# Patient Record
Sex: Female | Born: 1995 | Race: White | Hispanic: No | Marital: Single | Smoking: Current every day smoker
Health system: Southern US, Community
[De-identification: ages and names within clinical notes are randomized; demographics above are authoritative.]

## PROBLEM LIST (undated history)

## (undated) DIAGNOSIS — G43909 Migraine, unspecified, not intractable, without status migrainosus: Secondary | ICD-10-CM

## (undated) DIAGNOSIS — Z86018 Personal history of other benign neoplasm: Secondary | ICD-10-CM

## (undated) HISTORY — DX: Migraine, unspecified, not intractable, without status migrainosus: G43.909

## (undated) HISTORY — DX: Personal history of other benign neoplasm: Z86.018

## (undated) HISTORY — PX: TONSILLECTOMY AND ADENOIDECTOMY: SUR1326

---
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
## 2017-08-23 ENCOUNTER — Ambulatory Visit: Payer: Self-pay | Admitting: Family Medicine

## 2017-08-26 ENCOUNTER — Encounter: Payer: Self-pay | Admitting: General Practice

## 2020-09-19 ENCOUNTER — Other Ambulatory Visit: Payer: Self-pay | Admitting: *Deleted

## 2020-09-19 ENCOUNTER — Encounter: Payer: Self-pay | Admitting: *Deleted

## 2020-09-21 ENCOUNTER — Ambulatory Visit: Payer: No Typology Code available for payment source | Admitting: Diagnostic Neuroimaging

## 2020-11-25 ENCOUNTER — Ambulatory Visit: Payer: No Typology Code available for payment source | Admitting: Diagnostic Neuroimaging

## 2020-11-25 ENCOUNTER — Encounter: Payer: Self-pay | Admitting: Diagnostic Neuroimaging

## 2020-11-25 ENCOUNTER — Other Ambulatory Visit: Payer: Self-pay

## 2020-11-25 VITALS — BP 143/80 | HR 98 | Ht 65.0 in

## 2020-11-25 DIAGNOSIS — G43109 Migraine with aura, not intractable, without status migrainosus: Secondary | ICD-10-CM | POA: Diagnosis not present

## 2020-11-25 DIAGNOSIS — E237 Disorder of pituitary gland, unspecified: Secondary | ICD-10-CM

## 2020-11-25 MED ORDER — RIZATRIPTAN BENZOATE 10 MG PO TABS: ORAL_TABLET | ORAL | 6 refills | Status: DC

## 2020-11-25 MED ORDER — TOPIRAMATE 50 MG PO TABS: 50.0000 mg | ORAL_TABLET | Freq: Two times a day (BID) | ORAL | 12 refills | Status: AC

## 2020-11-25 NOTE — Patient Instructions (Signed)
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

## 2020-11-25 NOTE — Progress Notes (Signed)
[facility]  PATIENT: Frances Thompson DOB: [birth year]  REFERRING CLINICIAN: Jake Samples, PA* HISTORY FROM: patient  REASON FOR VISIT: new consult    HISTORICAL  CHIEF COMPLAINT:  Chief Complaint  Patient presents with  . New Patient (Initial Visit)    Rm 6, with fiance Cory, reports 2 migraines a week, takes OTC Tylenol with no relief, c/o mild dull headache today    HISTORY OF PRESENT ILLNESS:   24 year old female here for evaluation of headaches.  Her past 1 year patient has had intermittent frontal throbbing headaches associated with photophobia, phonophobia, seeing spots.  No nausea vomiting.  Headaches can last all day.  Averaging 2 headaches per week.  Patient has family history of migraine in sister.  Patient also had history of decreased hearing as a teenager, had MRI of the brain which showed small pituitary lesion.  Initially this was thought to be microadenoma but follow-up studies over 2-3 years showed reduction in size of lesion and therefore this was felt to be benign cyst.    REVIEW OF SYSTEMS: Full 14 system review of systems performed and negative with exception of: As per HPI.  ALLERGIES: Allergies  Allergen Reactions  . Clindamycin/Lincomycin Rash  . Penicillins Swelling  . Doxycycline Hives  . Phentermine Other (See Comments)    Heart flutter    HOME MEDICATIONS: Outpatient Medications Prior to Visit  Medication Sig Dispense Refill  . cetirizine (ZYRTEC) 10 MG chewable tablet Chew 10 mg by mouth daily.     No facility-administered medications prior to visit.    PAST MEDICAL HISTORY: Past Medical History:  Diagnosis Date  . History of benign pituitary tumor    age 50  . Migraines     PAST SURGICAL HISTORY: Past Surgical History:  Procedure Laterality Date  . TONSILLECTOMY AND ADENOIDECTOMY      FAMILY HISTORY: Family History  Problem Relation Age of Onset  . Migraines Mother   . Hypertension Mother    . Hyperlipidemia Mother   . Schizophrenia Sister   . Bipolar disorder Sister   . Stroke Maternal Grandmother   . COPD Maternal Grandmother   . Heart attack Maternal Grandmother   . Suicidality Maternal Grandfather     SOCIAL HISTORY: Social History   Socioeconomic History  . Marital status: Single    Spouse name: Not on file  . Number of children: 0  . Years of education: Not on file  . Highest education level: Not on file  Occupational History  . Not on file  Tobacco Use  . Smoking status: Current Every Day Smoker  . Smokeless tobacco: Never Used  Substance and Sexual Activity  . Alcohol use: Not Currently  . Drug use: Not Currently  . Sexual activity: Not on file  Other Topics Concern  . Not on file  Social History Narrative   Lives alone   Social Determinants of Health   Financial Resource Strain: Not on file  Food Insecurity: Not on file  Transportation Needs: Not on file  Physical Activity: Not on file  Stress: Not on file  Social Connections: Not on file  Intimate Partner Violence: Not on file     PHYSICAL EXAM  GENERAL EXAM/CONSTITUTIONAL: Vitals:  Vitals:   11/25/20 1005  BP: (!) 143/80  Pulse: 98  Height: 5\' 5"  (1.651 m)     There is no height or weight on file to calculate BMI. Wt Readings from Last 3 Encounters:  No data found for [employer]  Patient is in no distress; well developed, nourished and groomed; neck is supple  CARDIOVASCULAR:  Examination of carotid arteries is normal; no carotid bruits  Regular rate and rhythm, no murmurs  Examination of peripheral vascular system by observation and palpation is normal  EYES:  Ophthalmoscopic exam of optic discs and posterior segments is normal; no papilledema or hemorrhages  No exam data present  MUSCULOSKELETAL:  Gait, strength, tone, movements noted in Neurologic exam below  NEUROLOGIC: MENTAL STATUS:  No flowsheet data found.  awake, alert, oriented to person, place and  time  recent and remote memory intact  normal attention and concentration  language fluent, comprehension intact, naming intact  fund of knowledge appropriate  CRANIAL NERVE:   2nd - no papilledema on fundoscopic exam  2nd, 3rd, 4th, 6th - pupils equal and reactive to light, visual fields full to confrontation, extraocular muscles intact, no nystagmus  5th - facial sensation symmetric  7th - facial strength symmetric  8th - hearing intact  9th - palate elevates symmetrically, uvula midline  11th - shoulder shrug symmetric  12th - tongue protrusion midline  MOTOR:   normal bulk and tone, full strength in the BUE, BLE  SENSORY:   normal and symmetric to light touch, temperature, vibration  COORDINATION:   finger-nose-finger, fine finger movements normal  REFLEXES:   deep tendon reflexes present and symmetric  GAIT/STATION:   narrow based gait     DIAGNOSTIC DATA (LABS, IMAGING, TESTING) - I reviewed patient records, labs, notes, testing and imaging myself where available.  No results found for: WBC, HGB, HCT, MCV, PLT No results found for: NA, K, CL, CO2, GLUCOSE, BUN, CREATININE, CALCIUM, PROT, ALBUMIN, AST, ALT, ALKPHOS, BILITOT, GFRNONAA, GFRAA No results found for: CHOL, HDL, LDLCALC, LDLDIRECT, TRIG, CHOLHDL No results found for: HGBA1C No results found for: VITAMINB12 No results found for: TSH   06/11/12 MRI brain [I reviewed images myself and agree with interpretation. -VRP]  1. 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  2. The remainder of the examination within normal limits.  12/28/12 MRI brain - Similar appearance of pituitary lesion as detailed above.   12/30/13 MRI brain [I reviewed images myself and agree with interpretation. 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 2 mm diameter cavity  remains near the pars Intermedia, seen best on sagittal postcontrast  imaging.     ASSESSMENT AND PLAN  24 y.o. year old female here with:  Dx:  1. Migraine with aura and without status migrainosus, not intractable   2. 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 MR BRAIN W WO CONTRAST    Meds ordered this encounter  Medications  . topiramate (TOPAMAX) 50 MG tablet    Sig: Take 1 tablet (50 mg total) by mouth 2 (two) times daily.    Dispense:  60 tablet    Refill:  12  . rizatriptan (MAXALT) 10 MG tablet    Sig: As needed for headache; may repeat in 2 hours if needed; max 2 per day or 8 per month    Dispense:  8 tablet    Refill:  6   Return in about 6 months (around 05/26/2021) for with NP (Amy Lomax).    Penni Bombard, MD 89/48/3475, 83:07 AM Certified in Neurology, Neurophysiology and Neuroimaging  [facility] [address] [place], [place] [address] [phone]

## 2020-11-28 ENCOUNTER — Telehealth: Payer: Self-pay | Admitting: Diagnostic Neuroimaging

## 2020-11-28 NOTE — Telephone Encounter (Signed)
[facility] pending faxed notes

## 2020-12-07 NOTE — Telephone Encounter (Signed)
12/07/2020 Sent to [facility] Telephone [phone] MRI Berkley Harvey [id number] 11/28/2020 01/202022 Reff # [id number] .  [id number] Arma Heading Sent via E - mail / via [employer]

## 2020-12-20 ENCOUNTER — Encounter: Payer: Self-pay | Admitting: Internal Medicine

## 2020-12-24 ENCOUNTER — Other Ambulatory Visit: Payer: Self-pay

## 2021-01-18 ENCOUNTER — Ambulatory Visit: Payer: No Typology Code available for payment source | Admitting: Internal Medicine

## 2021-02-08 ENCOUNTER — Encounter: Payer: Self-pay | Admitting: Internal Medicine

## 2021-02-08 ENCOUNTER — Other Ambulatory Visit: Payer: Self-pay

## 2021-02-08 ENCOUNTER — Ambulatory Visit (INDEPENDENT_AMBULATORY_CARE_PROVIDER_SITE_OTHER): Payer: No Typology Code available for payment source | Admitting: Internal Medicine

## 2021-02-08 ENCOUNTER — Encounter: Payer: Self-pay | Admitting: *Deleted

## 2021-02-08 VITALS — BP 161/103 | HR 101 | Temp 97.8°F | Ht 64.0 in | Wt 324.2 lb

## 2021-02-08 DIAGNOSIS — R112 Nausea with vomiting, unspecified: Secondary | ICD-10-CM | POA: Diagnosis not present

## 2021-02-08 DIAGNOSIS — K5900 Constipation, unspecified: Secondary | ICD-10-CM

## 2021-02-08 DIAGNOSIS — R1013 Epigastric pain: Secondary | ICD-10-CM

## 2021-02-08 MED ORDER — ONDANSETRON HCL 4 MG PO TABS: 4.0000 mg | ORAL_TABLET | Freq: Three times a day (TID) | ORAL | 1 refills | Status: AC | PRN

## 2021-02-08 NOTE — Patient Instructions (Signed)
We will schedule you for upper endoscopy to further evaluate your abdominal pain and nausea.  I will also schedule ultrasound to evaluate your gallbladder.  I have sent in a prescription for Zofran to take as needed for nausea.  Further recommendations to follow.  At [facility] we value your feedback. You may receive a survey about your visit today. Please share your experience as we strive to create trusting relationships with our patients to provide genuine, compassionate, quality care.  We appreciate your understanding and patience as we review any laboratory studies, imaging, and other diagnostic tests that are ordered as we care for you. Our office policy is 5 business days for review of these results, and any emergent or urgent results are addressed in a timely manner for your best interest. If you do not hear from our office in 1 week, please contact us.   We also encourage the use of MyChart, which contains your medical information for your review as well. If you are not enrolled in this feature, an access code is on this after visit summary for your convenience. Thank you for allowing [place] to be involved in your care.  It was great to see you today!  I hope you have a great rest of your spring!!    Elon Alas. Abbey Chatters, D.O. Gastroenterology and Hepatology [facility]

## 2021-02-08 NOTE — Progress Notes (Signed)
Primary Care Physician:  Redmond School, MD Primary Gastroenterologist:  Dr. Abbey Chatters  Chief Complaint  Patient presents with  . Nausea    Worse after meals and before bed  . Abdominal Pain    Upper abd/luq, comes/goes  . Constipation    X 2-3 weeks     HPI:   Frances Thompson is a 25 y.o. female who presents to the clinic today by referral from her PCP Dr. Gerarda Fraction for evaluation.  Patient states she has postprandial nausea with occasional vomiting.  Denies any coffee-ground emesis or hematemesis.  No melena hematochezia.  Also notes epigastric pain that radiates to her back sometimes. this has been going on for some time now.  She was placed on omeprazole 20 mg twice daily which helped her symptoms some.  No overt dysphagia but does note personal history of allergic rhinitis and asthma.  No chronic NSAID use.  Patient also notes constipation which started approximately 1 month ago.  She used to go 2-3 times daily but now only goes 1 time a day.  Past Medical History:  Diagnosis Date  . History of benign pituitary tumor    age 64  . Migraines     Past Surgical History:  Procedure Laterality Date  . TONSILLECTOMY AND ADENOIDECTOMY      Current Outpatient Medications  Medication Sig Dispense Refill  . cetirizine (ZYRTEC) 10 MG chewable tablet Chew 10 mg by mouth daily.    Marland Kitchen topiramate (TOPAMAX) 50 MG tablet Take 1 tablet (50 mg total) by mouth 2 (two) times daily. (Patient taking differently: Take 50 mg by mouth 2 (two) times daily. As needed) 60 tablet 12   No current facility-administered medications for this visit.    Allergies as of 02/08/2021 - Review Complete 02/08/2021  Allergen Reaction Noted  . Clindamycin/lincomycin Rash 04/08/2015  . Penicillins Swelling 04/08/2015  . Doxycycline Hives 09/19/2020  . Phentermine Other (See Comments) 09/19/2020    Family History  Problem Relation Age of Onset  . Migraines Mother   . Hypertension Mother   . Hyperlipidemia  Mother   . Schizophrenia Sister   . Bipolar disorder Sister   . Stroke Maternal Grandmother   . COPD Maternal Grandmother   . Heart attack Maternal Grandmother   . Suicidality Maternal Grandfather     Social History   Socioeconomic History  . Marital status: Single    Spouse name: Not on file  . Number of children: 0  . Years of education: Not on file  . Highest education level: Not on file  Occupational History  . Not on file  Tobacco Use  . Smoking status: Current Every Day Smoker  . Smokeless tobacco: Never Used  Substance and Sexual Activity  . Alcohol use: Not Currently  . Drug use: Not Currently  . Sexual activity: Not on file  Other Topics Concern  . Not on file  Social History Narrative   Lives alone   Social Determinants of Health   Financial Resource Strain: Not on file  Food Insecurity: Not on file  Transportation Needs: Not on file  Physical Activity: Not on file  Stress: Not on file  Social Connections: Not on file  Intimate Partner Violence: Not on file    Subjective: Review of Systems  Constitutional: Negative for chills and fever.  HENT: Negative for congestion and hearing loss.   Eyes: Negative for blurred vision and double vision.  Respiratory: Negative for cough and shortness of breath.   Cardiovascular: Negative  for chest pain and palpitations.  Gastrointestinal: Negative for abdominal pain, blood in stool, constipation, diarrhea, heartburn, melena and vomiting.  Genitourinary: Negative for dysuria and urgency.  Musculoskeletal: Negative for joint pain and myalgias.  Skin: Negative for itching and rash.  Neurological: Negative for dizziness and headaches.  Psychiatric/Behavioral: Negative for depression. The patient is not nervous/anxious.        Objective: BP (!) 161/103   Pulse (!) 101   Temp 97.8 F (36.6 C)   Ht 5\' 4"  (1.626 m)   Wt (!) 324 lb 3.2 oz (147.1 kg)   LMP 12/12/2020   BMI 55.65 kg/m  Physical  Exam Constitutional:      Appearance: Normal appearance.  HENT:     Head: Normocephalic and atraumatic.  Eyes:     Extraocular Movements: Extraocular movements intact.     Conjunctiva/sclera: Conjunctivae normal.  Cardiovascular:     Rate and Rhythm: Normal rate and regular rhythm.  Pulmonary:     Effort: Pulmonary effort is normal.     Breath sounds: Normal breath sounds.  Abdominal:     General: Bowel sounds are normal.     Palpations: Abdomen is soft.  Musculoskeletal:        General: No swelling. Normal range of motion.     Cervical back: Normal range of motion and neck supple.  Skin:    General: Skin is warm and dry.     Coloration: Skin is not jaundiced.  Neurological:     General: No focal deficit present.     Mental Status: She is alert and oriented to person, place, and time.  Psychiatric:        Mood and Affect: Mood normal.        Behavior: Behavior normal.      Assessment: *Epigastric pain *Nausea and vomiting *Constipation  Plan: Etiology of patient's symptoms unclear, we will schedule for EGD to evaluate for peptic ulcer disease, esophagitis, gastritis, H. Pylori, duodenitis, or other. Will also evaluate for esophageal stricture, Schatzki's ring, esophageal web or other.   The risks including infection, bleed, or perforation as well as benefits, limitations, alternatives and imponderables have been reviewed with the patient. Potential for esophageal dilation, biopsy, etc. have also been reviewed.  Questions have been answered. All parties agreeable.  Order right upper quadrant ultrasound to evaluate for biliary colic.  I have sent in a prescription for Zofran to take as needed for nausea.  Thank you Dr. Gerarda Fraction for the kind referral.  Further recommendations to follow   02/08/2021 3:14 PM   Disclaimer: This note was dictated with voice recognition software. Similar sounding words can inadvertently be transcribed and may not be corrected upon review.

## 2021-02-09 ENCOUNTER — Encounter: Payer: Self-pay | Admitting: *Deleted

## 2021-02-16 ENCOUNTER — Telehealth: Payer: Self-pay

## 2021-02-16 ENCOUNTER — Other Ambulatory Visit: Payer: Self-pay

## 2021-02-16 NOTE — Telephone Encounter (Signed)
[facility], spoke to [place], no PA needed for EGD. Pt has no surgery benefits. Ref# [id number].

## 2021-02-17 ENCOUNTER — Ambulatory Visit (HOSPITAL_COMMUNITY)
Admission: RE | Admit: 2021-02-17 | Discharge: 2021-02-17 | Disposition: A | Discharge status: Home / Self Care | Payer: Self-pay | Source: Ambulatory Visit | Attending: Internal Medicine | Admitting: Internal Medicine

## 2021-02-17 DIAGNOSIS — R112 Nausea with vomiting, unspecified: Secondary | ICD-10-CM | POA: Insufficient documentation

## 2021-02-17 DIAGNOSIS — R1013 Epigastric pain: Secondary | ICD-10-CM | POA: Insufficient documentation

## 2021-03-08 NOTE — Patient Instructions (Signed)
Frances Thompson  03/08/2021     @PREFPERIOPPHARMACY @   Your procedure is scheduled on  03/14/2021.   [facility]   Call this number if you have problems the morning of surgery:  [phone]   Remember:  Follow the diet instructions given to you by the office.                     Take these medicines the morning of surgery with A SIP OF WATER  Prilosec.    Please brush your teeth.  Do not wear jewelry, make-up or nail polish.  Do not wear lotions, powders, or perfumes, or deodorant.  Do not shave 48 hours prior to surgery.  Men may shave face and neck.  Do not bring valuables to the hospital.  [facility] is not responsible for any belongings or valuables.  Contacts, dentures or bridgework may not be worn into surgery.  Leave your suitcase in the car.  After surgery it may be brought to your room.  For patients admitted to the hospital, discharge time will be determined by your treatment team.  Patients discharged the day of surgery will not be allowed to drive home and must have someone with them for 24 hours.    Special instructions:  DO NOT smoke tobacco or vape the morning of your procedure.  Please read over the following fact sheets that you were given. Anesthesia Post-op Instructions and Care and Recovery After Surgery       Upper Endoscopy, Adult, Care After This sheet gives you information about how to care for yourself after your procedure. Your health care provider may also give you more specific instructions. If you have problems or questions, contact your health care provider. What can I expect after the procedure? After the procedure, it is common to have:  A sore throat.  Mild stomach pain or discomfort.  Bloating.  Nausea. Follow these instructions at home:  Follow instructions from your health care provider about what to eat or drink after your procedure.  Return to your normal activities as told by your health  care provider. Ask your health care provider what activities are safe for you.  Take over-the-counter and prescription medicines only as told by your health care provider.  If you were given a sedative during the procedure, it can affect you for several hours. Do not drive or operate machinery until your health care provider says that it is safe.  Keep all follow-up visits as told by your health care provider. This is important.   Contact a health care provider if you have:  A sore throat that lasts longer than one day.  Trouble swallowing. Get help right away if:  You vomit blood or your vomit looks like coffee grounds.  You have: ? A fever. ? Bloody, black, or tarry stools. ? A severe sore throat or you cannot swallow. ? Difficulty breathing. ? Severe pain in your chest or abdomen. Summary  After the procedure, it is common to have a sore throat, mild stomach discomfort, bloating, and nausea.  If you were given a sedative during the procedure, it can affect you for several hours. Do not drive or operate machinery until your health care provider says that it is safe.  Follow instructions from your health care provider about what to eat or drink after your procedure.  Return to your normal activities as told by your health  care provider. This information is not intended to replace advice given to you by your health care provider. Make sure you discuss any questions you have with your health care provider. Document Revised: 11/24/2019 Document Reviewed: 04/28/2018 Elsevier Patient Education  2021 [place] After This sheet gives you information about how to care for yourself after your procedure. Your health care provider may also give you more specific instructions. If you have problems or questions, contact your health care provider. What can I expect after the procedure? After the procedure, it is common to  have:  Tiredness.  Forgetfulness about what happened after the procedure.  Impaired judgment for important decisions.  Nausea or vomiting.  Some difficulty with balance. Follow these instructions at home: For the time period you were told by your health care provider:  Rest as needed.  Do not participate in activities where you could fall or become injured.  Do not drive or use machinery.  Do not drink alcohol.  Do not take sleeping pills or medicines that cause drowsiness.  Do not make important decisions or sign legal documents.  Do not take care of children on your own.      Eating and drinking  Follow the diet that is recommended by your health care provider.  Drink enough fluid to keep your urine pale yellow.  If you vomit: ? Drink water, juice, or soup when you can drink without vomiting. ? Make sure you have little or no nausea before eating solid foods. General instructions  Have a responsible adult stay with you for the time you are told. It is important to have someone help care for you until you are awake and alert.  Take over-the-counter and prescription medicines only as told by your health care provider.  If you have sleep apnea, surgery and certain medicines can increase your risk for breathing problems. Follow instructions from your health care provider about wearing your sleep device: ? Anytime you are sleeping, including during daytime naps. ? While taking prescription pain medicines, sleeping medicines, or medicines that make you drowsy.  Avoid smoking.  Keep all follow-up visits as told by your health care provider. This is important. Contact a health care provider if:  You keep feeling nauseous or you keep vomiting.  You feel light-headed.  You are still sleepy or having trouble with balance after 24 hours.  You develop a rash.  You have a fever.  You have redness or swelling around the IV site. Get help right away if:  You have  trouble breathing.  You have new-onset confusion at home. Summary  For several hours after your procedure, you may feel tired. You may also be forgetful and have poor judgment.  Have a responsible adult stay with you for the time you are told. It is important to have someone help care for you until you are awake and alert.  Rest as told. Do not drive or operate machinery. Do not drink alcohol or take sleeping pills.  Get help right away if you have trouble breathing, or if you suddenly become confused. This information is not intended to replace advice given to you by your health care provider. Make sure you discuss any questions you have with your health care provider. Document Revised: 08/11/2020 Document Reviewed: 10/29/2019 Elsevier Patient Education  2021 [employer].

## 2021-03-10 ENCOUNTER — Encounter (HOSPITAL_COMMUNITY): Payer: Self-pay

## 2021-03-10 ENCOUNTER — Other Ambulatory Visit (HOSPITAL_COMMUNITY): Payer: No Typology Code available for payment source

## 2021-03-10 ENCOUNTER — Telehealth: Payer: Self-pay | Admitting: *Deleted

## 2021-03-10 ENCOUNTER — Encounter (HOSPITAL_COMMUNITY)
Admission: RE | Admit: 2021-03-10 | Discharge: 2021-03-10 | Disposition: A | Discharge status: Home / Self Care | Payer: No Typology Code available for payment source | Source: Ambulatory Visit | Attending: Internal Medicine | Admitting: Internal Medicine

## 2021-03-10 NOTE — Telephone Encounter (Signed)
Called pt as received message she no showed for pre-op/covid test appt today. She needs to r/s at a later time and she is going to call back toward end of the month to r/s. Called endo and made aware

## 2021-03-14 ENCOUNTER — Ambulatory Visit (HOSPITAL_COMMUNITY): Admission: RE | Admit: 2021-03-14 | Payer: No Typology Code available for payment source | Source: Home / Self Care

## 2021-03-14 ENCOUNTER — Encounter (HOSPITAL_COMMUNITY): Admission: RE | Payer: Self-pay | Source: Home / Self Care

## 2021-03-14 SURGERY — ESOPHAGOGASTRODUODENOSCOPY (EGD) WITH PROPOFOL
Anesthesia: Monitor Anesthesia Care

## 2021-05-29 ENCOUNTER — Encounter: Payer: Self-pay | Admitting: Family Medicine

## 2021-05-29 ENCOUNTER — Ambulatory Visit: Payer: No Typology Code available for payment source | Admitting: Family Medicine

## 2022-08-08 IMAGING — US US ABDOMEN LIMITED RUQ/ASCITES
1 series · 14 of 25 positions shown · non-contrast
Comparison: None.

CLINICAL DATA: Right upper quadrant pain with nausea and vomiting
intermittent x1 month.

EXAM:
ULTRASOUND ABDOMEN LIMITED RIGHT UPPER QUADRANT

[Series 1: us abdomen limited ruq (liver/gb) · 14 of 68 slices shown]
[im 1/68]
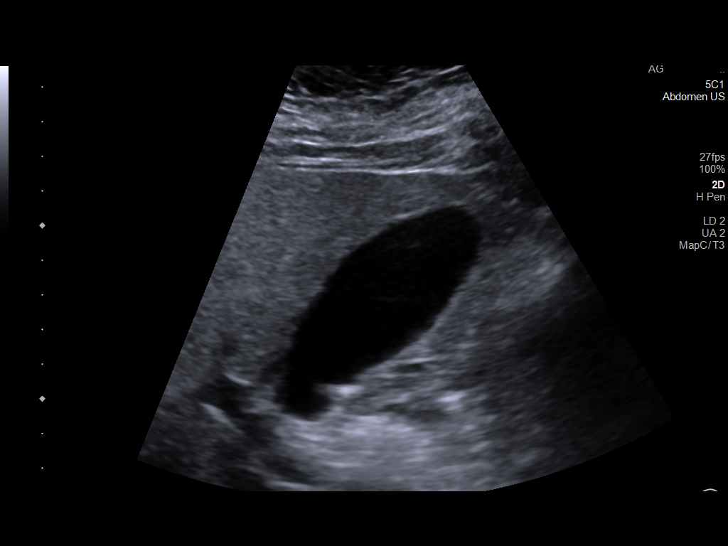
[im 6/68]
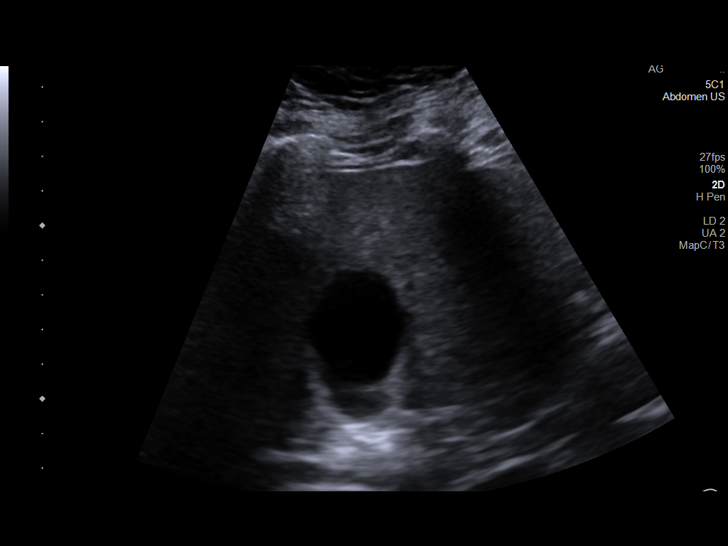
[im 12/68]
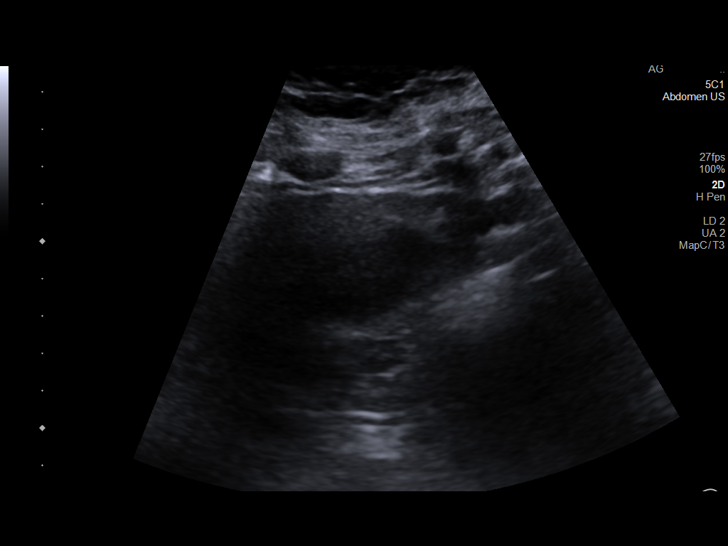
[im 17/68]
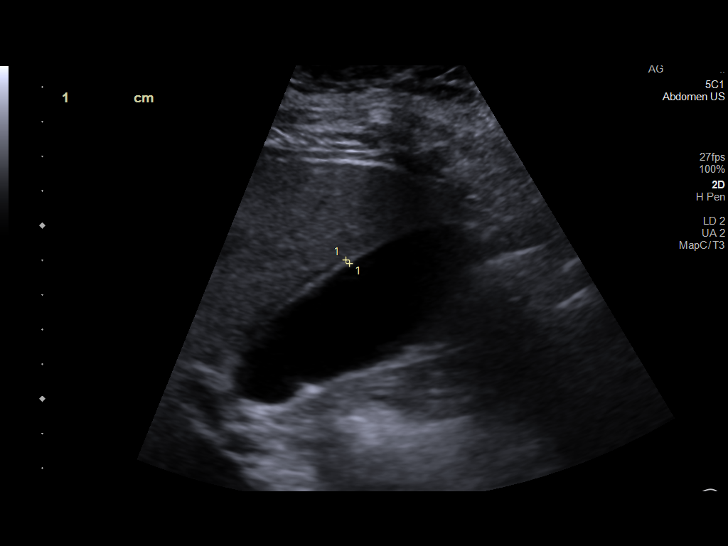
[im 23/68]
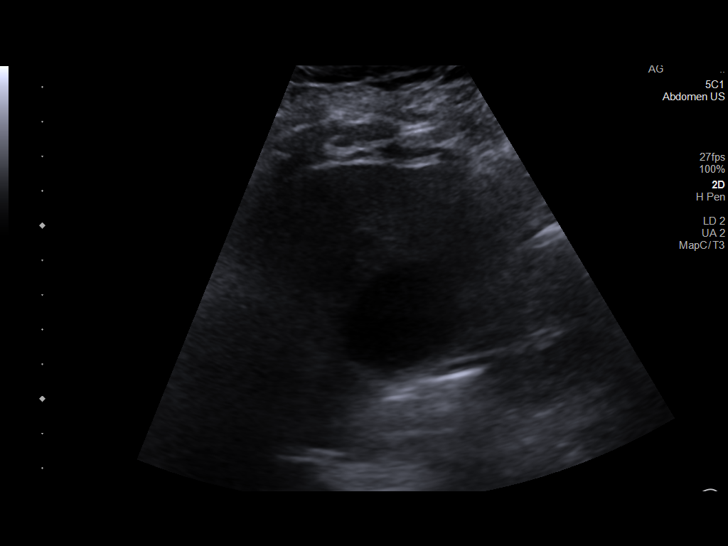
[im 26/68]
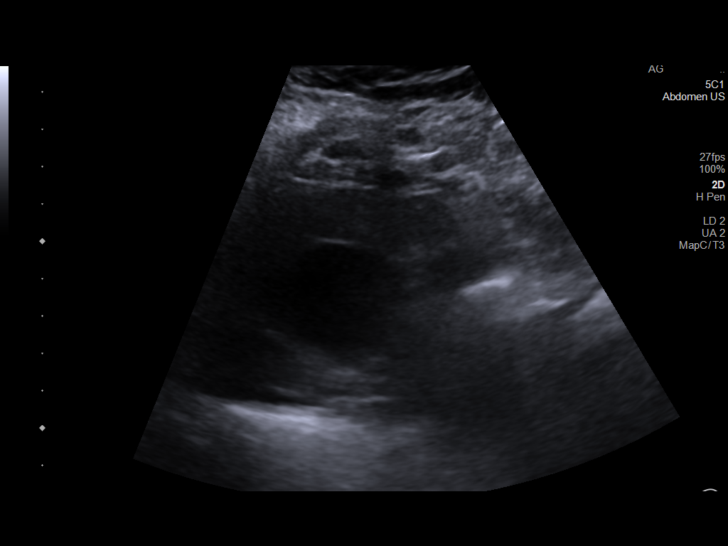
[im 31/68]
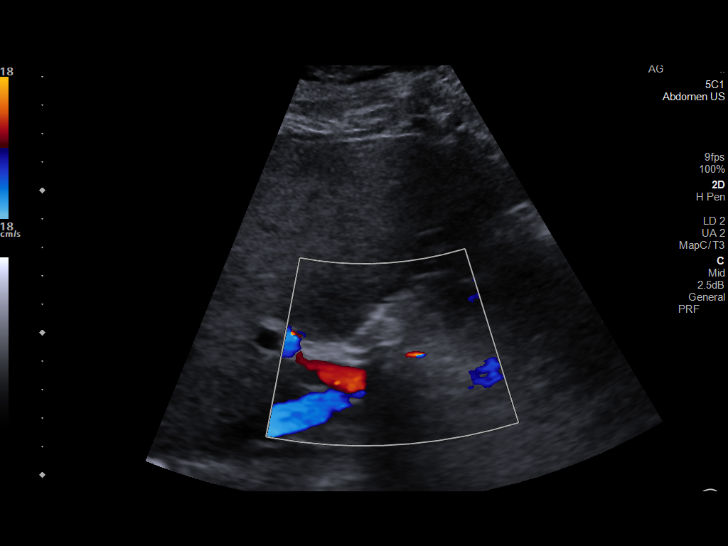
[im 37/68]
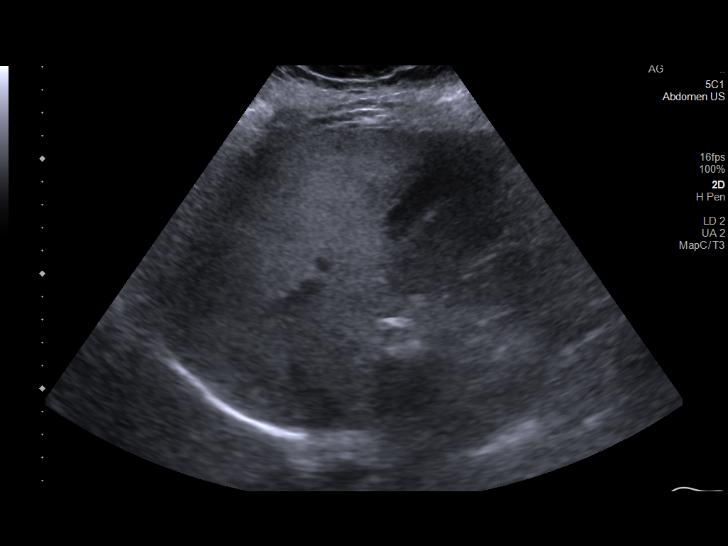
[im 42/68]
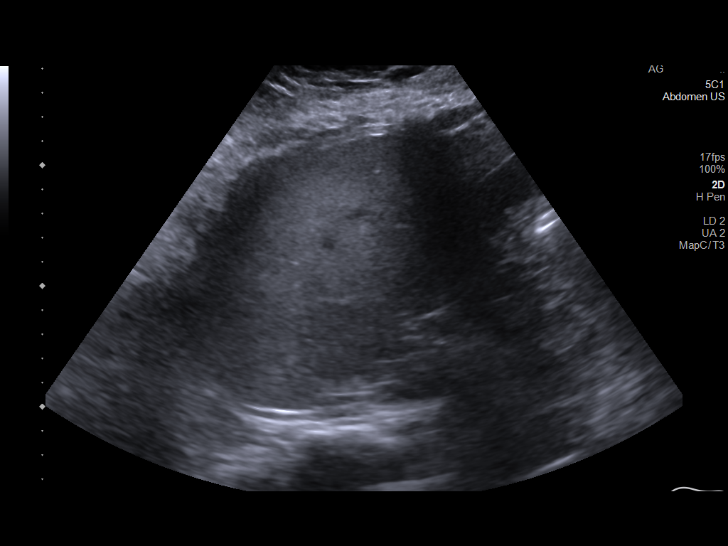
[im 45/68]
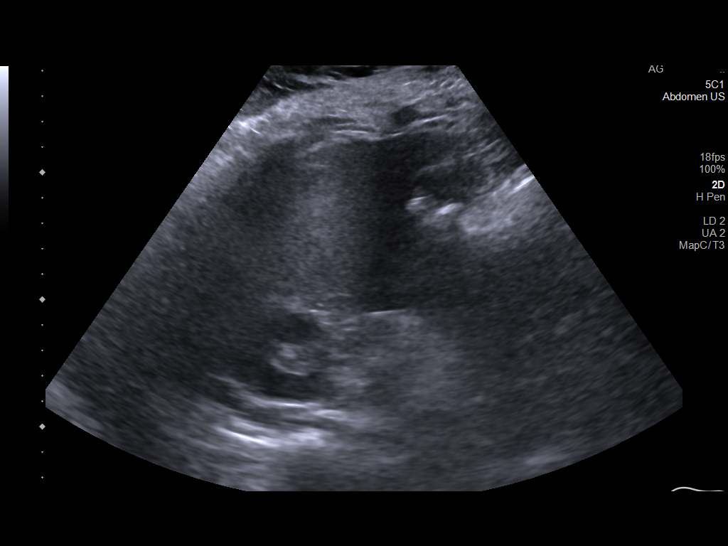
[im 51/68]
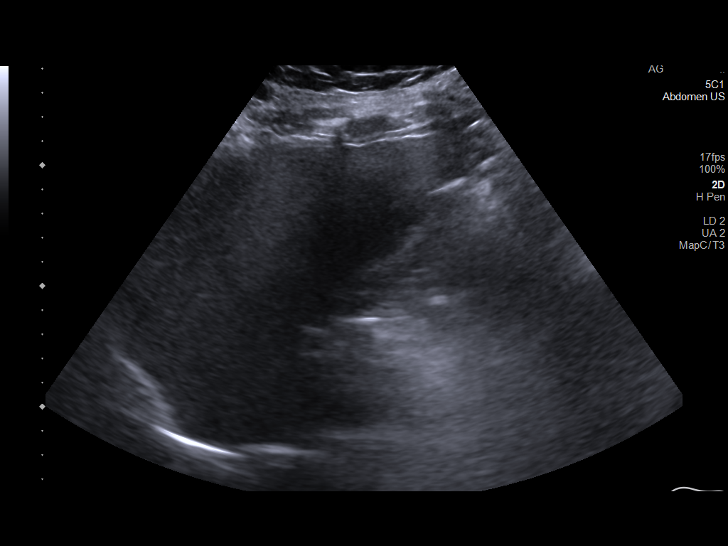
[im 56/68]
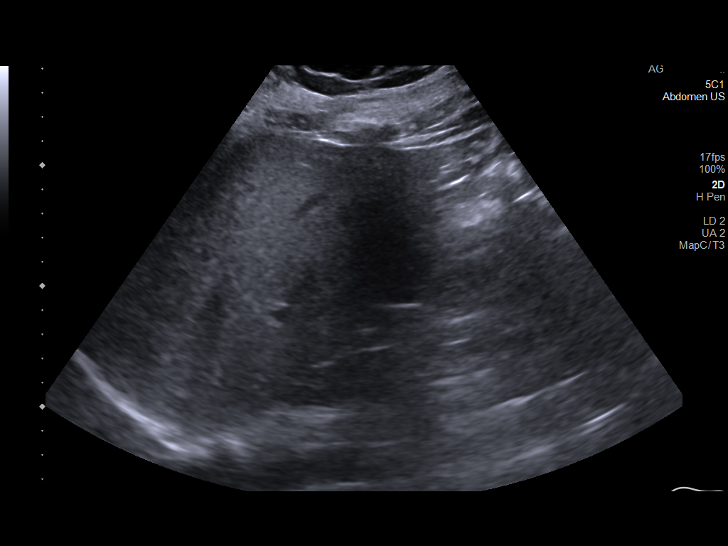
[im 62/68]
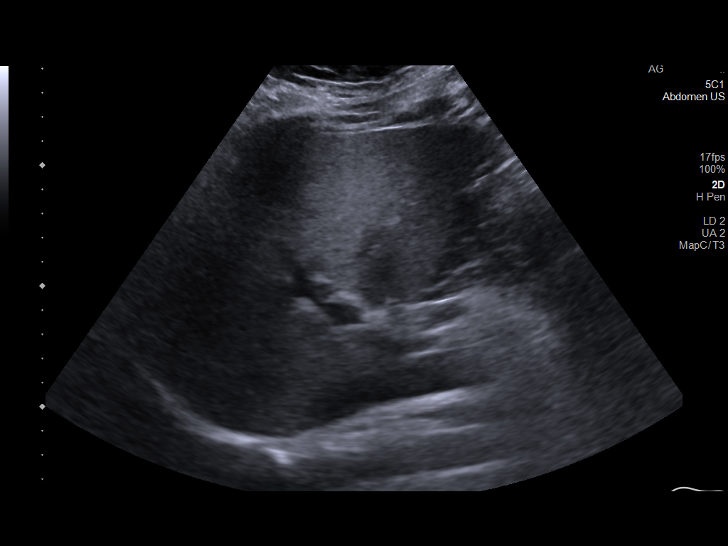
[im 68/68]
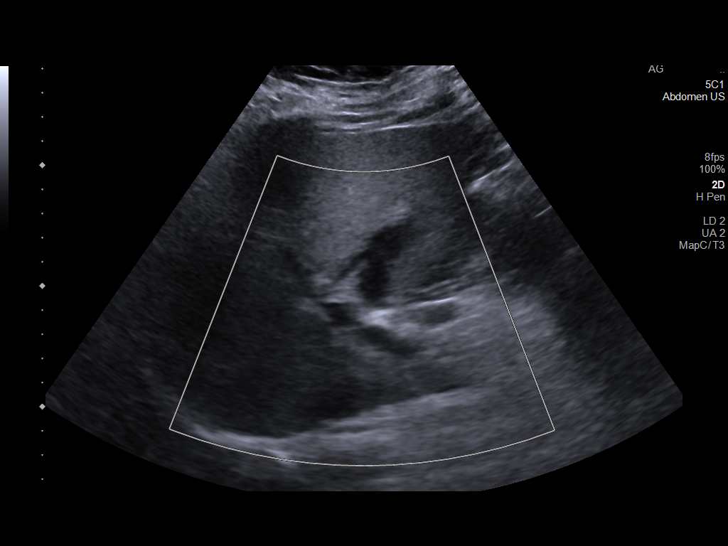

[14 of 25 positions shown; findings below may reference images not displayed]

FINDINGS: Gallbladder:

No gallstones or wall thickening visualized. No sonographic Murphy
sign noted by sonographer.

Common bile duct:

Diameter: 3 mm

Liver:

No focal lesion identified. Diffusely increased parenchymal
echogenicity. Portal vein is patent on color Doppler imaging with
normal direction of blood flow towards the liver.

Other: None.
IMPRESSION: The echogenicity of the liver is increased. This is a nonspecific
finding but is most commonly seen with fatty infiltration of the
liver. There are no obvious focal liver lesions identified.
